# Patient Record
Sex: Female | Born: 1995 | Race: Black or African American | Hispanic: No | Marital: Single | Smoking: Never smoker
Health system: Southern US, Community
[De-identification: ages and names within clinical notes are randomized; demographics above are authoritative.]

## PROBLEM LIST (undated history)

## (undated) DIAGNOSIS — J45909 Unspecified asthma, uncomplicated: Secondary | ICD-10-CM

---

## 2018-01-09 ENCOUNTER — Encounter (HOSPITAL_COMMUNITY): Payer: Self-pay | Admitting: *Deleted

## 2018-01-09 ENCOUNTER — Emergency Department (HOSPITAL_COMMUNITY)
Admission: EM | Admit: 2018-01-09 | Discharge: 2018-01-09 | Disposition: A | Discharge status: Home / Self Care | Payer: BLUE CROSS/BLUE SHIELD | Attending: Emergency Medicine | Admitting: Emergency Medicine

## 2018-01-09 DIAGNOSIS — G43909 Migraine, unspecified, not intractable, without status migrainosus: Secondary | ICD-10-CM | POA: Diagnosis not present

## 2018-01-09 DIAGNOSIS — J45909 Unspecified asthma, uncomplicated: Secondary | ICD-10-CM | POA: Insufficient documentation

## 2018-01-09 DIAGNOSIS — R51 Headache: Secondary | ICD-10-CM | POA: Diagnosis present

## 2018-01-09 DIAGNOSIS — F439 Reaction to severe stress, unspecified: Secondary | ICD-10-CM

## 2018-01-09 HISTORY — DX: Unspecified asthma, uncomplicated: J45.909

## 2018-01-09 MED ORDER — METOCLOPRAMIDE HCL 5 MG/ML IJ SOLN
10.0000 mg | Freq: Once | INTRAMUSCULAR | Status: AC
  Administered 2018-01-09: 10 mg via INTRAVENOUS
  Filled 2018-01-09: qty 2

## 2018-01-09 MED ORDER — KETOROLAC TROMETHAMINE 30 MG/ML IJ SOLN
30.0000 mg | Freq: Once | INTRAMUSCULAR | Status: AC
Start: 2018-01-09 — End: 2018-01-09
  Administered 2018-01-09: 30 mg via INTRAVENOUS
  Filled 2018-01-09: qty 1

## 2018-01-09 MED ORDER — DIPHENHYDRAMINE HCL 50 MG/ML IJ SOLN
25.0000 mg | Freq: Once | INTRAMUSCULAR | Status: AC
  Administered 2018-01-09: 25 mg via INTRAVENOUS
  Filled 2018-01-09: qty 1

## 2018-01-09 MED ORDER — DEXAMETHASONE SODIUM PHOSPHATE 10 MG/ML IJ SOLN
10.0000 mg | Freq: Once | INTRAMUSCULAR | Status: AC
  Administered 2018-01-09: 10 mg via INTRAVENOUS
  Filled 2018-01-09: qty 1

## 2018-01-09 MED ORDER — SODIUM CHLORIDE 0.9 % IV BOLUS (SEPSIS)
1000.0000 mL | Freq: Once | INTRAVENOUS | Status: AC
  Administered 2018-01-09: 1000 mL via INTRAVENOUS

## 2018-01-09 MED ORDER — METOCLOPRAMIDE HCL 10 MG PO TABS: 10.0000 mg | ORAL_TABLET | Freq: Four times a day (QID) | ORAL | 0 refills | Status: AC | PRN

## 2018-01-09 NOTE — ED Triage Notes (Addendum)
Per EMS, [facility] complains of migraine since 11AM today. Pt has not taken medication today.   BP 122/84 HR 74 RR 18 CBG 79

## 2018-01-09 NOTE — Discharge Instructions (Signed)
Alternate between tylenol and motrin as needed for pain. Use reglan as needed for headaches or nausea. Stay well hydrated. Get plenty of rest. Try to manage your stress as best you can. Follow up with your regular doctor or the campus clinic in 1 week for recheck of symptoms and ongoing management of your headaches. Return to the ER for changes or worsening symptoms.

## 2018-01-09 NOTE — ED Notes (Addendum)
Pt belongings found in room during cleaning call to number on file was incorrect number and staff was unable to reach Ms Miranda Payne umbrella was labled with pt arm band and left at security office.

## 2018-01-09 NOTE — ED Provider Notes (Signed)
[facility]-EMERGENCY DEPT Provider Note   CSN: [id number] Arrival date & time: 01/09/18  1432     History   Chief Complaint Chief Complaint  Patient presents with  . Headache    HPI Miranda Payne is a 22 y.o. female with a PMHx of asthma and migraines, who presents to the ED with complaints of a migraine that began around 2 PM.  Patient states that she was finishing a quiz and leaving the study room when she developed a migraine and felt lightheaded and generally weak.  She states that she has been under a lot of stress recently and thinks this is contributing to her migraines.  She states that she has migraines very frequently, he usually takes sumatriptan but did not take it today.  She reports that today's migraine feels the same as her prior migraines.  She describes the headache as 5/10 constant throbbing generalized headache that occasionally radiates into her neck, worse with loud noises, and with no treatments tried prior to arrival.  She reports associated lightheadedness and generalized fatigue/weakness, which is common with her migraines.  Her PCP is in [place].  She is here going to school at [employer].   She denies fevers, chills, URI symptoms, cough, vision changes, CP, SOB, abd pain, N/V/D/C, hematuria, dysuria, myalgias, arthralgias, numbness, tingling, focal weakness, or any other complaints at this time.    The history is provided by the patient and medical records. No language interpreter was used.  Headache   This is a recurrent problem. The current episode started 3 to 5 hours ago. The problem occurs constantly. The problem has not changed since onset.The headache is associated with emotional stress. Pain location: generalized. The quality of the pain is described as throbbing. The pain is at a severity of 5/10. The pain is mild. The pain radiates to the right neck and left neck. Pertinent negatives include no fever, no shortness of breath, no nausea and  no vomiting. She has tried nothing for the symptoms. The treatment provided no relief.    Past Medical History:  Diagnosis Date  . Asthma     There are no active problems to display for this patient.   History reviewed. No pertinent surgical history.  OB History    No data available       Home Medications    Prior to Admission medications   Not on File    Family History No family history on file.  Social History Social History   Tobacco Use  . Smoking status: Never Smoker  . Smokeless tobacco: Never Used  Substance Use Topics  . Alcohol use: No    Frequency: Never  . Drug use: No     Allergies   Patient has no allergy information on record.   Review of Systems Review of Systems  Constitutional: Positive for fatigue (generalized weakness). Negative for chills and fever.  HENT: Negative for congestion, rhinorrhea and sore throat.   Eyes: Negative for visual disturbance.  Respiratory: Negative for cough and shortness of breath.   Cardiovascular: Negative for chest pain.  Gastrointestinal: Negative for abdominal pain, constipation, diarrhea, nausea and vomiting.  Genitourinary: Negative for dysuria and hematuria.  Musculoskeletal: Negative for arthralgias and myalgias.  Skin: Negative for color change.  Allergic/Immunologic: Negative for immunocompromised state.  Neurological: Positive for light-headedness and headaches. Negative for weakness and numbness.  Psychiatric/Behavioral: Negative for confusion.   All other systems reviewed and are negative for acute change except as noted in  the HPI.    Physical Exam Updated Vital Signs BP 118/77 (BP Location: Right Arm)   Pulse 88   Temp 98.2 F (36.8 C) (Oral)   Resp 20   LMP 12/26/2017   SpO2 100%   Physical Exam  Constitutional: She is oriented to person, place, and time. Vital signs are normal. She appears well-developed and well-nourished.  Non-toxic appearance. No distress.  Afebrile, nontoxic,  NAD  HENT:  Head: Normocephalic and atraumatic.  Mouth/Throat: Oropharynx is clear and moist and mucous membranes are normal.  Eyes: Conjunctivae and EOM are normal. Pupils are equal, round, and reactive to light. Right eye exhibits no discharge. Left eye exhibits no discharge.  PERRL, EOMI, no nystagmus, no visual field deficits   Neck: Normal range of motion. Neck supple. No spinous process tenderness and no muscular tenderness present. No neck rigidity. Normal range of motion present.  FROM intact without spinous process TTP, no bony stepoffs or deformities, no paraspinous muscle TTP or muscle spasms. No rigidity or meningeal signs. No bruising or swelling.   Cardiovascular: Normal rate, regular rhythm, normal heart sounds and intact distal pulses. Exam reveals no gallop and no friction rub.  No murmur heard. Pulmonary/Chest: Effort normal and breath sounds normal. No respiratory distress. She has no decreased breath sounds. She has no wheezes. She has no rhonchi. She has no rales.  Abdominal: Soft. Normal appearance and bowel sounds are normal. She exhibits no distension. There is no tenderness. There is no rigidity, no rebound, no guarding, no CVA tenderness, no tenderness at McBurney's point and negative Murphy's sign.  Musculoskeletal: Normal range of motion.  MAE x4 Strength and sensation grossly intact in all extremities Distal pulses intact Gait steady  Neurological: She is alert and oriented to person, place, and time. She has normal strength. No cranial nerve deficit or sensory deficit. Coordination and gait normal. GCS eye subscore is 4. GCS verbal subscore is 5. GCS motor subscore is 6.  CN 2-12 grossly intact A&O x4 GCS 15 Sensation and strength intact Gait nonataxic including with tandem walking Coordination with finger-to-nose WNL Neg pronator drift   Skin: Skin is warm, dry and intact. No rash noted.  Psychiatric: She has a normal mood and affect.  Nursing note and vitals  reviewed.    ED Treatments / Results  Labs (all labs ordered are listed, but only abnormal results are displayed) Labs Reviewed - No data to display  EKG  EKG Interpretation None       Radiology No results found.  Procedures Procedures (including critical care time)  Medications Ordered in ED Medications  metoCLOPramide (REGLAN) injection 10 mg (10 mg Intravenous Given 01/09/18 1844)    And  diphenhydrAMINE (BENADRYL) injection 25 mg (25 mg Intravenous Given 01/09/18 1845)    And  sodium chloride 0.9 % bolus 1,000 mL (1,000 mLs Intravenous New Bag/Given 01/09/18 1845)    And  ketorolac (TORADOL) 30 MG/ML injection 30 mg (30 mg Intravenous Given 01/09/18 1844)  dexamethasone (DECADRON) injection 10 mg (10 mg Intravenous Given 01/09/18 1845)     Initial Impression / Assessment and Plan / ED Course  I have reviewed the triage vital signs and the nursing notes.  Pertinent labs & imaging results that were available during my care of the patient were reviewed by me and considered in my medical decision making (see chart for details).     22 y.o. female here with migraine x4hrs with associated generalized weakness and lightheadedness which usually accompany her migraines.  States this feels same as prior migraines. On exam, no focal neuro deficit, VSS, pt in NAD. Will give migraine cocktail, doubt need for labs/imaging at this time. Will reassess shortly.   9:03 PM Pt feeling much better, symptoms fully resolved. Will send home with reglan to use if migraines occur, discussed stress reduction, adequate hydration and rest, use of OTC remedies for symptom control, and use of home meds. Discussed f/up with PCP or campus clinic in 1wk for recheck of symptoms and ongoing management of headaches/migraines. I explained the diagnosis and have given explicit precautions to return to the ER including for any other new or worsening symptoms. The patient understands and accepts the medical plan  as it's been dictated and I have answered their questions. Discharge instructions concerning home care and prescriptions have been given. The patient is STABLE and is discharged to home in good condition.    Final Clinical Impressions(s) / ED Diagnoses   Final diagnoses:  Migraine without status migrainosus, not intractable, unspecified migraine type  Stress    ED Discharge Orders        Ordered    metoCLOPramide (REGLAN) 10 MG tablet  Every 6 hours PRN     01/09/18 [address]57       Kortney Potvin, GordonMercedes, New JerseyPA-C 01/09/18 2104    Vanetta MuldersZackowski, Scott, MD 01/09/18 [phone]

## 2020-01-19 ENCOUNTER — Encounter (HOSPITAL_COMMUNITY): Payer: Self-pay | Admitting: Emergency Medicine

## 2020-01-19 ENCOUNTER — Emergency Department (HOSPITAL_COMMUNITY)
Admission: EM | Admit: 2020-01-19 | Discharge: 2020-01-20 | Disposition: A | Discharge status: Home / Self Care | Payer: Managed Care, Other (non HMO) | Attending: Emergency Medicine | Admitting: Emergency Medicine

## 2020-01-19 ENCOUNTER — Other Ambulatory Visit: Payer: Self-pay

## 2020-01-19 ENCOUNTER — Emergency Department (HOSPITAL_COMMUNITY): Payer: Managed Care, Other (non HMO)

## 2020-01-19 DIAGNOSIS — Y929 Unspecified place or not applicable: Secondary | ICD-10-CM | POA: Insufficient documentation

## 2020-01-19 DIAGNOSIS — Z79899 Other long term (current) drug therapy: Secondary | ICD-10-CM | POA: Insufficient documentation

## 2020-01-19 DIAGNOSIS — S59902A Unspecified injury of left elbow, initial encounter: Secondary | ICD-10-CM | POA: Diagnosis present

## 2020-01-19 DIAGNOSIS — W010XXA Fall on same level from slipping, tripping and stumbling without subsequent striking against object, initial encounter: Secondary | ICD-10-CM | POA: Insufficient documentation

## 2020-01-19 DIAGNOSIS — Y9341 Activity, dancing: Secondary | ICD-10-CM | POA: Insufficient documentation

## 2020-01-19 DIAGNOSIS — J45909 Unspecified asthma, uncomplicated: Secondary | ICD-10-CM | POA: Diagnosis not present

## 2020-01-19 DIAGNOSIS — S53105A Unspecified dislocation of left ulnohumeral joint, initial encounter: Secondary | ICD-10-CM

## 2020-01-19 DIAGNOSIS — Y999 Unspecified external cause status: Secondary | ICD-10-CM | POA: Insufficient documentation

## 2020-01-19 MED ORDER — MORPHINE SULFATE (PF) 4 MG/ML IV SOLN
4.0000 mg | Freq: Once | INTRAVENOUS | Status: AC
  Administered 2020-01-19: 4 mg via INTRAVENOUS
  Filled 2020-01-19: qty 1

## 2020-01-19 MED ORDER — FENTANYL CITRATE (PF) 100 MCG/2ML IJ SOLN
200.0000 ug | Freq: Once | INTRAMUSCULAR | Status: DC
  Filled 2020-01-19: qty 4

## 2020-01-19 MED ORDER — OXYCODONE-ACETAMINOPHEN 5-325 MG PO TABS
1.0000 | ORAL_TABLET | Freq: Once | ORAL | Status: DC
  Filled 2020-01-19: qty 1

## 2020-01-19 MED ORDER — PROPOFOL 10 MG/ML IV BOLUS
1.0000 mg/kg | Freq: Once | INTRAVENOUS | Status: AC
  Administered 2020-01-19: 22:00:00 40 mg via INTRAVENOUS
  Filled 2020-01-19: qty 20

## 2020-01-19 MED ORDER — IBUPROFEN 200 MG PO TABS
600.0000 mg | ORAL_TABLET | Freq: Once | ORAL | Status: DC
Start: 2020-01-19 — End: 2020-01-20
  Filled 2020-01-19: qty 3

## 2020-01-19 NOTE — Sedation Documentation (Signed)
X-ray called to bedside for verification

## 2020-01-19 NOTE — Sedation Documentation (Signed)
Shoulder relocated 

## 2020-01-19 NOTE — ED Provider Notes (Signed)
[facility] DEPT Provider Note   CSN: [id number] Arrival date & time: 01/19/20  2123   See [facility] chart for full note  History Chief Complaint  Patient presents with  . Elbow Pain  . Elbow Injury    Miranda Payne is a 24 y.o. female.  HPI     Past Medical History:  Diagnosis Date  . Asthma     There are no problems to display for this patient.   History reviewed. No pertinent surgical history.   OB History   No obstetric history on file.     No family history on file.  Social History   Tobacco Use  . Smoking status: Never Smoker  . Smokeless tobacco: Never Used  Substance Use Topics  . Alcohol use: No  . Drug use: No    Home Medications Prior to Admission medications   Medication Sig Start Date End Date Taking? Authorizing Provider  albuterol (PROVENTIL HFA;VENTOLIN HFA) 108 (90 Base) MCG/ACT inhaler Inhale 2 puffs into the lungs every 6 (six) hours as needed for wheezing or shortness of breath.    [provider]  fluticasone (FLOVENT HFA) 220 MCG/ACT inhaler Inhale 2 puffs into the lungs 2 (two) times daily as needed (sob and wheezing).    [provider]  ibuprofen (ADVIL,MOTRIN) 200 MG tablet Take 400 mg by mouth every 6 (six) hours as needed for headache.    [provider]  metoCLOPramide (REGLAN) 10 MG tablet Take 1 tablet (10 mg total) by mouth every 6 (six) hours as needed for nausea (nausea/headache). 01/09/18   Street, [place], PA-C  SUMAtriptan (IMITREX) 25 MG tablet Take 25 mg by mouth every 2 (two) hours as needed for migraine or headache. May repeat in 2 hours if headache persists or recurs.    [provider]    Allergies    Patient has no known allergies.  Review of Systems   Review of Systems  Physical Exam Updated Vital Signs BP 126/89   Pulse 86   Temp 98.2 F (36.8 C) (Oral)   Resp 11   Ht 5' (1.524 m)   Wt 83 kg   SpO2 100%   BMI 35.74 kg/m   Physical  Exam  ED Results / Procedures / Treatments   Labs (all labs ordered are listed, but only abnormal results are displayed) Labs Reviewed - No data to display  EKG None  Radiology DG Elbow Complete Left  Result Date: 01/19/2020 CLINICAL DATA:  Fall, elbow pain EXAM: LEFT ELBOW - COMPLETE 3+ VIEW COMPARISON:  None FINDINGS: Posterolateral dislocation of the elbow with the articular surfaces of the radius and ulna positioned posterolateral to the humeral head. Tiny 2 mm mineralization adjacent the capitellum may reflect a small fracture fragment. No other clearly discernible fracture is evident. Associated elbow effusion and soft tissue deformity is noted as well. IMPRESSION: Posterolateral dislocation of the elbow with a tiny 2 mm fracture fragment adjacent the capitellum. Electronically Signed   By: Lovena Le M.D.   On: 01/19/2020 22:04    Procedures .Sedation  Date/Time: 01/19/2020 10:47 PM Performed by: Hayden Rasmussen, MD Authorized by: Hayden Rasmussen, MD   Consent:    Consent obtained:  Verbal and written   Consent given by:  Patient   Risks discussed:  Allergic reaction, dysrhythmia, inadequate sedation, nausea, prolonged hypoxia resulting in organ damage, prolonged sedation necessitating reversal, respiratory compromise necessitating ventilatory assistance and intubation and vomiting   Alternatives discussed:  Analgesia without sedation, anxiolysis and regional anesthesia Universal protocol:    Procedure explained and questions answered to patient or proxy's satisfaction: yes     Relevant documents present and verified: yes     Test results available and properly labeled: yes     Imaging studies available: yes     Required blood products, implants, devices, and special equipment available: yes     Site/side marked: yes     Immediately prior to procedure a time out was called: yes     Patient identity confirmation method:  Verbally with patient and hospital-assigned  identification number Indications:    Procedure necessitating sedation performed by:  Physician performing sedation Pre-sedation assessment:    Time since last food or drink:  4   ASA classification: class 1 - normal, healthy patient     Neck mobility: normal     Mouth opening:  3 or more finger widths   Thyromental distance:  4 finger widths   Mallampati score:  I - soft palate, uvula, fauces, pillars visible   Pre-sedation assessments completed and reviewed: airway patency, cardiovascular function, hydration status, mental status, nausea/vomiting, pain level, respiratory function and temperature     Pre-sedation assessment completed:  01/19/2020 10:25 PM Immediate pre-procedure details:    Reassessment: Patient reassessed immediately prior to procedure     Reviewed: vital signs, relevant labs/tests and NPO status     Verified: bag valve mask available, emergency equipment available, intubation equipment available, IV patency confirmed, oxygen available and suction available   Procedure details (see MAR for exact dosages):    Preoxygenation:  Nasal cannula   Sedation:  Propofol   Intended level of sedation: deep   Intra-procedure monitoring:  Blood pressure monitoring, cardiac monitor, continuous pulse oximetry, frequent LOC assessments, frequent vital sign checks and continuous capnometry   Intra-procedure events: none     Total Provider sedation time (minutes):  15 Post-procedure details:    Attendance: Constant attendance by certified staff until patient recovered     Recovery: Patient returned to pre-procedure baseline     Post-sedation assessments completed and reviewed: airway patency, cardiovascular function, hydration status, mental status, nausea/vomiting, pain level, respiratory function and temperature     Patient is stable for discharge or admission: yes     Patient tolerance:  Tolerated well, no immediate complications Reduction of dislocation  Date/Time: 01/19/2020 10:49  PM Performed by: Terrilee Files, MD Authorized by: Terrilee Files, MD  Consent: Written consent obtained. Risks and benefits: risks, benefits and alternatives were discussed Consent given by: patient Patient understanding: patient states understanding of the procedure being performed Patient consent: the patient's understanding of the procedure matches consent given Procedure consent: procedure consent matches procedure scheduled Relevant documents: relevant documents present and verified Imaging studies: imaging studies available Required items: required blood products, implants, devices, and special equipment available Patient identity confirmed: arm band and verbally with patient Time out: Immediately prior to procedure a "time out" was called to verify the correct patient, procedure, equipment, support staff and site/side marked as required. Local anesthesia used: no  Anesthesia: Local anesthesia used: no  Sedation: Patient sedated: yes Sedatives: propofol  Patient tolerance: patient tolerated the procedure well with no immediate complications Comments: Closed reduction left elbow dislocation    (including critical care time)  Medications Ordered in ED Medications  fentaNYL (SUBLIMAZE) injection 200 mcg (has no administration in time range)  propofol (DIPRIVAN) 10 mg/mL bolus/IV push 83 mg (40 mg Intravenous Given 01/19/20 2228)  ED Course  I have reviewed the triage vital signs and the nursing notes.  Pertinent labs & imaging results that were available during my care of the patient were reviewed by me and considered in my medical decision making (see chart for details).  Clinical Course as of Jan 19 826  Wynelle Link Jan 19, 2020  1650 24 year old left hand dominant injury to left elbow.  X-ray showing dislocation with small chip fracture.  Distal neurovascular intact.  Patient received conscious sedation after informed consent and closed reduction with apparent success.   Getting postreduction imaging.   [MB]  2255 Post-reduction film shows successful reduction. Will continue to observe until return to baseline.   [SU]  2324 Patient having increased "shock-like pain" at elbow without radiation. IV pain medications ordered.    [SU]  Mon Jan 20, 2020  0028 Pain is better, still significant with any movement. Patient reassured, discussed care management, medication use and orthopedic follow up if no better in 3-4 days.   [SU]    Clinical Course User Index [MB] Terrilee Files, MD [SU] Elpidio Anis, PA-C   MDM Rules/Calculators/A&P                       Final Clinical Impression(s) / ED Diagnoses Final diagnoses:  Dislocation of left elbow, initial encounter    Rx / DC Orders ED Discharge Orders    None       Terrilee Files, MD 01/20/20 [phone]

## 2020-01-19 NOTE — ED Triage Notes (Signed)
Patient here from home with complaints of left elbow pain after fall today when dancing. Splint noted. Pain 10/10.

## 2020-01-19 NOTE — ED Provider Notes (Signed)
[facility]-EMERGENCY DEPT Provider Note   CSN: [id number] Arrival date & time: 01/19/20  2123     History Chief Complaint  Patient presents with  . Elbow Pain  . Elbow Injury    Miranda Payne is a 24 y.o. female.  Patient to ED with left elbow injury. 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 She is left hand dominant. No other injury. She denies hand, wrist or shoulder pain on the left. She did not hit her head. No chest, abdomen or other extremity pain.   The history is provided by the patient. No language interpreter was used.       Past Medical History:  Diagnosis Date  . Asthma     There are no problems to display for this patient.   History reviewed. No pertinent surgical history.   OB History   No obstetric history on file.     No family history on file.  Social History   Tobacco Use  . Smoking status: Never Smoker  . Smokeless tobacco: Never Used  Substance Use Topics  . Alcohol use: No  . Drug use: No    Home Medications Prior to Admission medications   Medication Sig Start Date End Date Taking? Authorizing Provider  albuterol (PROVENTIL HFA;VENTOLIN HFA) 108 (90 Base) MCG/ACT inhaler Inhale 2 puffs into the lungs every 6 (six) hours as needed for wheezing or shortness of breath.    [provider]  fluticasone (FLOVENT HFA) 220 MCG/ACT inhaler Inhale 2 puffs into the lungs 2 (two) times daily as needed (sob and wheezing).    [provider]  ibuprofen (ADVIL,MOTRIN) 200 MG tablet Take 400 mg by mouth every 6 (six) hours as needed for headache.    [provider]  metoCLOPramide (REGLAN) 10 MG tablet Take 1 tablet (10 mg total) by mouth every 6 (six) hours as needed for nausea (nausea/headache). 01/09/18   Street, [place], PA-C  SUMAtriptan (IMITREX) 25 MG tablet Take 25 mg by mouth every 2 (two) hours as needed for migraine or headache. May repeat in 2 hours if  headache persists or recurs.    [provider]    Allergies    Patient has no known allergies.  Review of Systems   Review of Systems  Cardiovascular: Negative for chest pain.  Gastrointestinal: Negative for abdominal pain.  Musculoskeletal:       See HPI.  Skin: Negative for wound.  Neurological: Negative for numbness and headaches.    Physical Exam Updated Vital Signs BP 126/89   Pulse 64   Temp 98.2 F (36.8 C) (Oral)   Resp (!) 22   Ht 5' (1.524 m)   Wt 83 kg   SpO2 100%   BMI 35.74 kg/m   Physical Exam Vitals and nursing note reviewed.  Constitutional:      Appearance: She is well-developed.  HENT:     Head: Normocephalic and atraumatic.  Cardiovascular:     Rate and Rhythm: Normal rate and regular rhythm.  Pulmonary:     Effort: Pulmonary effort is normal.     Breath sounds: Normal breath sounds.  Abdominal:     General: Bowel sounds are normal.     Palpations: Abdomen is soft.     Tenderness: There is no abdominal tenderness. There is no guarding or rebound.  Musculoskeletal:        General: Normal range of motion.     Cervical back: Normal range of  motion and neck supple.     Comments: No midline cervical tenderness. Moves all extremities with the exception of left arm which causes pain with any movement. Moves all left fingers with coordination intact. Left elbow has posterior deformity.  Skin:    General: Skin is warm and dry.     Comments: No bleeding or open wounds.  Neurological:     Mental Status: She is alert.     Sensory: No sensory deficit.     ED Results / Procedures / Treatments   Labs (all labs ordered are listed, but only abnormal results are displayed) Labs Reviewed - No data to display  EKG None  Radiology DG Elbow Complete Left  Result Date: 01/19/2020 CLINICAL DATA:  Fall, elbow pain EXAM: LEFT ELBOW - COMPLETE 3+ VIEW COMPARISON:  None FINDINGS: Posterolateral dislocation of the elbow with the articular surfaces of  the radius and ulna positioned posterolateral to the humeral head. Tiny 2 mm mineralization adjacent the capitellum may reflect a small fracture fragment. No other clearly discernible fracture is evident. Associated elbow effusion and soft tissue deformity is noted as well. IMPRESSION: Posterolateral dislocation of the elbow with a tiny 2 mm fracture fragment adjacent the capitellum. Electronically Signed   By: Lovena Le M.D.   On: 01/19/2020 22:04    Procedures Procedures (including critical care time)  Medications Ordered in ED Medications  propofol (DIPRIVAN) 10 mg/mL bolus/IV push 83 mg (has no administration in time range)  fentaNYL (SUBLIMAZE) injection 200 mcg (has no administration in time range)    ED Course  I have reviewed the triage vital signs and the nursing notes.  Pertinent labs & imaging results that were available during my care of the patient were reviewed by me and considered in my medical decision making (see chart for details).  Clinical Course as of Jan 20 28  Sun Jan 18, 3158  6171 24 year old left hand dominant injury to left elbow.  X-ray showing dislocation with small chip fracture.  Distal neurovascular intact.  Patient received conscious sedation after informed consent and closed reduction with apparent success.  Getting postreduction imaging.   [MB]  2255 Post-reduction film shows successful reduction. Will continue to observe until return to baseline.   [SU]  2324 Patient having increased "shock-like pain" at elbow without radiation. IV pain medications ordered.    [SU]  Mon Jan 20, 2020  0028 Pain is better, still significant with any movement. Patient reassured, discussed care management, medication use and orthopedic follow up if no better in 3-4 days.   [SU]    Clinical Course User Index [MB] Hayden Rasmussen, MD [SU] Charlann Lange, PA-C   MDM Rules/Calculators/A&P                      Patient to ED with left elbow injury. Imaging shows  posterolateral dislocation. Conscious sedation performed for successful reduction. See notes of Dr. Aletta Edouard for sedation and reduction procedure notes.   Final Clinical Impression(s) / ED Diagnoses Final diagnoses:  None   1. Left elbow dislocation  Rx / DC Orders ED Discharge Orders    None       Charlann Lange, PA-C 01/20/20 0029    Hayden Rasmussen, MD 01/20/20 [phone]

## 2020-01-19 NOTE — Progress Notes (Signed)
Orthopedic Tech Progress Note Patient Details:  Miranda Payne [birth year] [id number]  Ortho Devices Type of Ortho Device: Sling immobilizer, Post (long arm) splint Ortho Device/Splint Location: lue. applied post reduction at drs request. Ortho Device/Splint Interventions: Ordered, Application, Adjustment   Post Interventions Patient Tolerated: Well Instructions Provided: Care of device, Adjustment of device   Trinna Post 01/19/2020, 11:25 PM

## 2020-01-20 MED ORDER — OXYCODONE-ACETAMINOPHEN 5-325 MG PO TABS: 1.0000 | ORAL_TABLET | Freq: Four times a day (QID) | ORAL | 0 refills | Status: AC | PRN

## 2020-01-20 MED ORDER — METHOCARBAMOL 500 MG PO TABS: 500.0000 mg | ORAL_TABLET | Freq: Two times a day (BID) | ORAL | 0 refills | Status: AC

## 2020-01-20 NOTE — Discharge Instructions (Signed)
Take the medications as prescribed for pain and spasm. Ice to reduce the swelling.   If pain is no better in 3-4 days, follow up with Dr. Magnus Ivan for further evaluation and treatment.   Return to the emergency department as needed.

## 2021-05-31 IMAGING — DX DG ELBOW COMPLETE 3+V*L*
4 series · 4 of 4 positions shown · non-contrast
Comparison: None

CLINICAL DATA: Fall, elbow pain

EXAM:
LEFT ELBOW - COMPLETE 3+ VIEW

[elbow ap (1 of 2)]
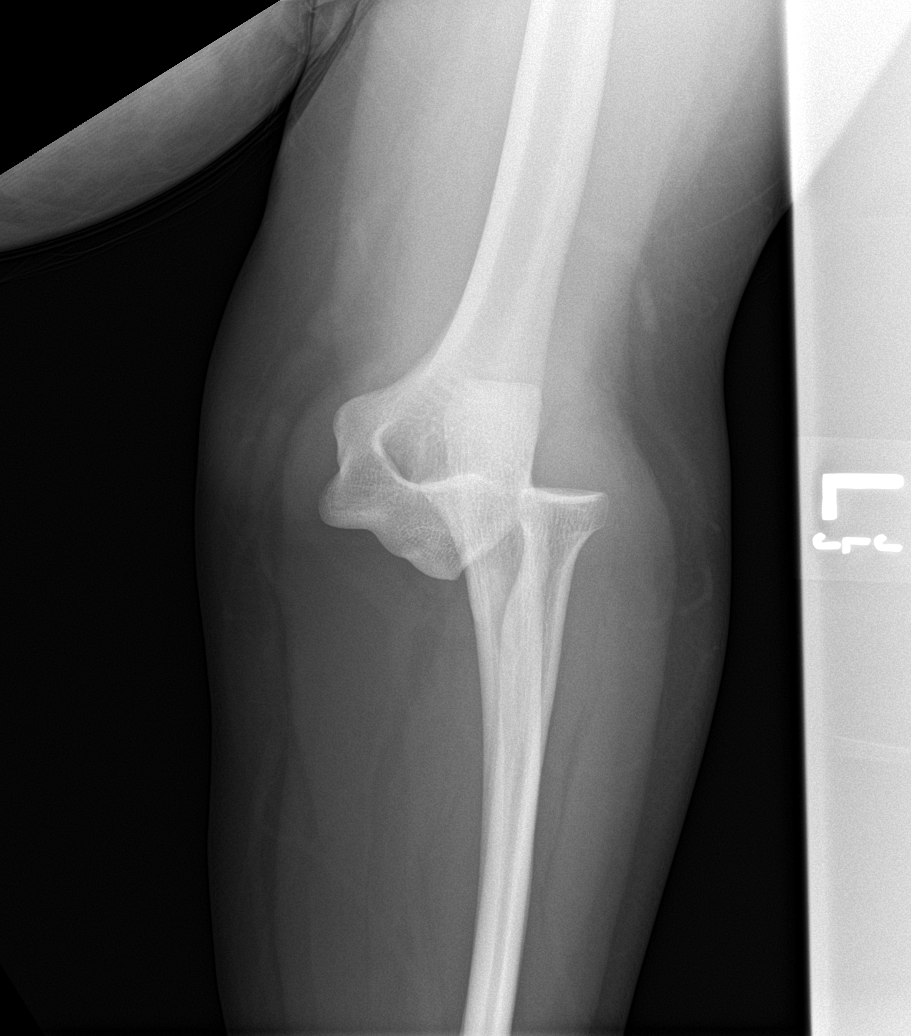

[elbow ap (2 of 2)]
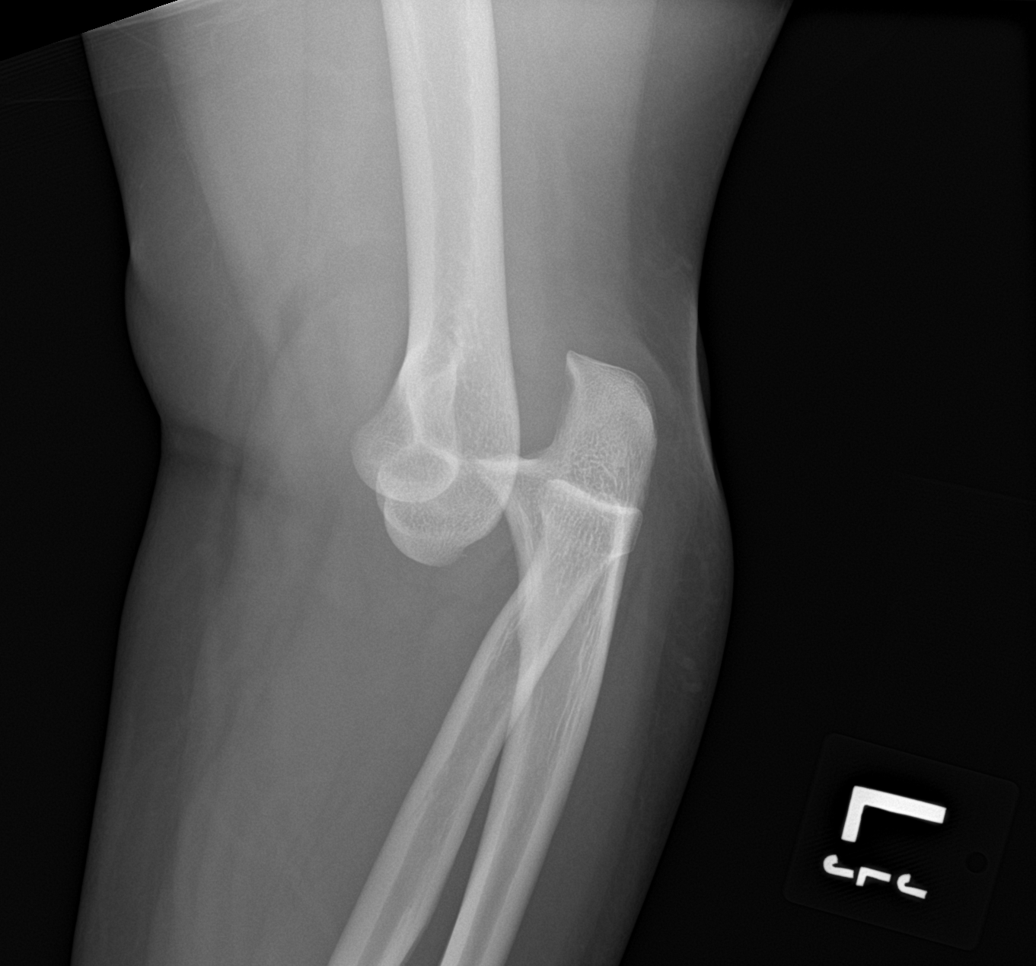

[elbow lat]
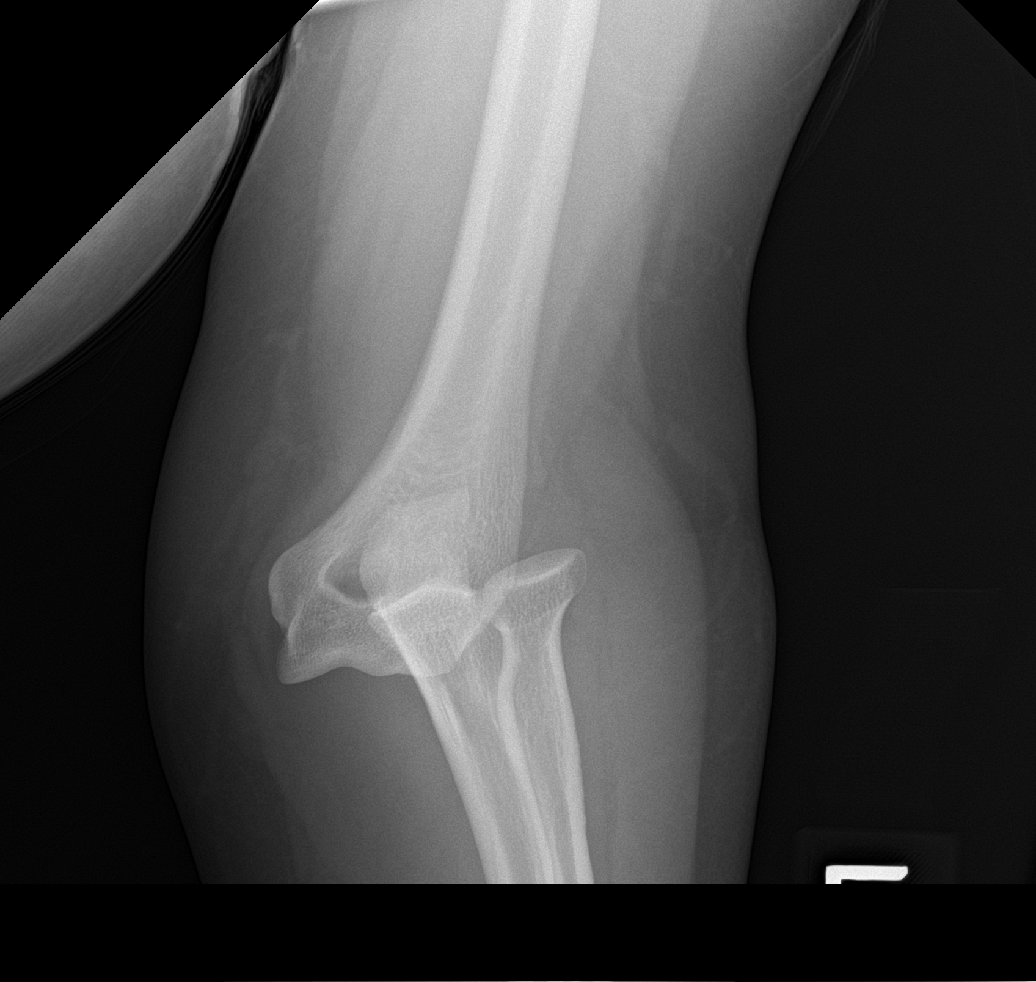

[elbow obl]
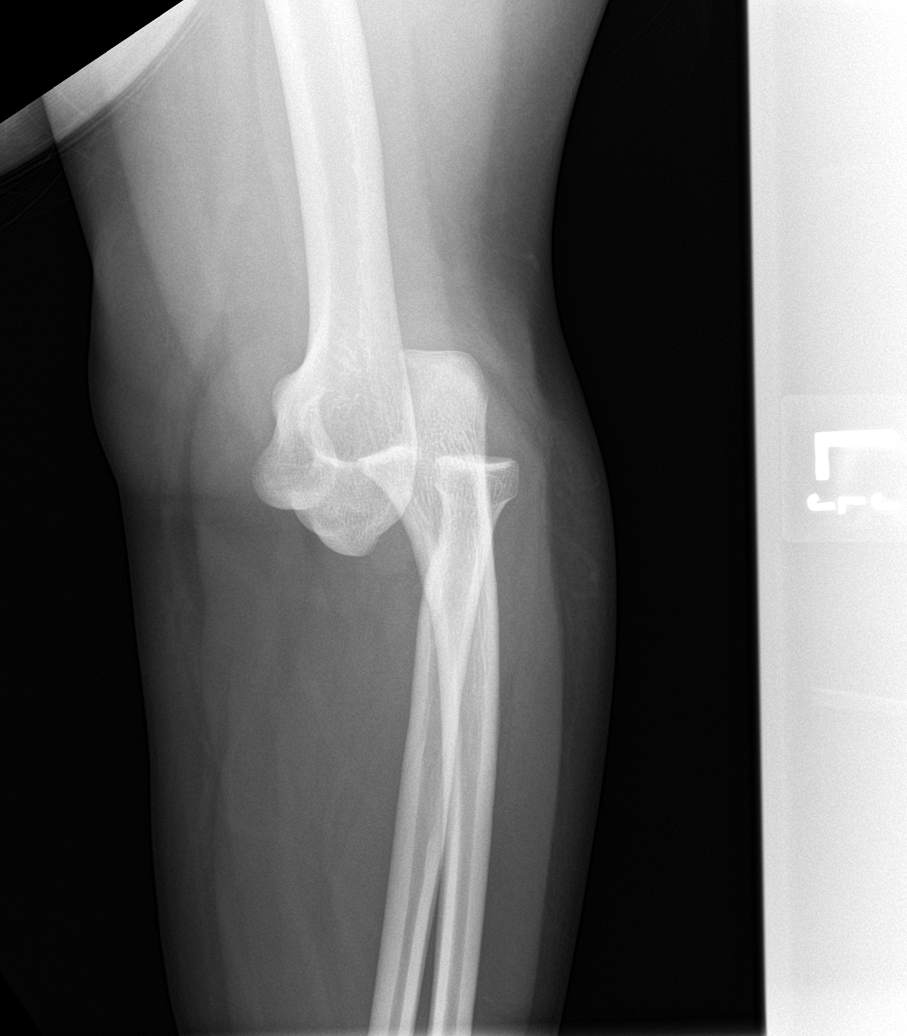

[4 of 4 positions shown; findings below may reference images not displayed]

FINDINGS: Posterolateral dislocation of the elbow with the articular surfaces
of the radius and ulna positioned posterolateral to the humeral
head. Tiny 2 mm mineralization adjacent the capitellum may reflect a
small fracture fragment. No other clearly discernible fracture is
evident. Associated elbow effusion and soft tissue deformity is
noted as well.
IMPRESSION: Posterolateral dislocation of the elbow with a tiny 2 mm fracture
fragment adjacent the capitellum.
